# Patient Record
Sex: Male | Born: 1965 | Race: White | Hispanic: No | Marital: Single
Health system: Southern US, Community
[De-identification: ages and names within clinical notes are randomized; demographics above are authoritative.]

---

## 2013-12-08 ENCOUNTER — Ambulatory Visit: Payer: Self-pay | Admitting: Otolaryngology

## 2013-12-24 ENCOUNTER — Ambulatory Visit: Payer: Self-pay | Admitting: Otolaryngology

## 2013-12-26 LAB — PATHOLOGY REPORT

## 2014-03-17 ENCOUNTER — Emergency Department: Payer: Self-pay | Admitting: Emergency Medicine

## 2014-03-17 LAB — CBC
HCT: 48 % (ref 40.0–52.0)
HGB: 16.1 g/dL (ref 13.0–18.0)
MCH: 28.8 pg (ref 26.0–34.0)
MCHC: 33.5 g/dL (ref 32.0–36.0)
MCV: 86 fL (ref 80–100)
Platelet: 279 10*3/uL (ref 150–440)
RBC: 5.57 10*6/uL (ref 4.40–5.90)
RDW: 14.2 % (ref 11.5–14.5)
WBC: 8.3 10*3/uL (ref 3.8–10.6)

## 2014-03-17 LAB — BASIC METABOLIC PANEL
Anion Gap: 7 (ref 7–16)
BUN: 13 mg/dL (ref 7–18)
CHLORIDE: 105 mmol/L (ref 98–107)
CREATININE: 1.02 mg/dL (ref 0.60–1.30)
Calcium, Total: 8.6 mg/dL (ref 8.5–10.1)
Co2: 27 mmol/L (ref 21–32)
EGFR (Non-African Amer.): >60
GLUCOSE: 158 mg/dL — AB (ref 65–99)
OSMOLALITY: 281 (ref 275–301)
POTASSIUM: 3.1 mmol/L — AB (ref 3.5–5.1)
SODIUM: 139 mmol/L (ref 136–145)

## 2014-03-17 LAB — URINALYSIS, COMPLETE
Bacteria: NONE SEEN
Bilirubin,UR: NEGATIVE
Leukocyte Esterase: NEGATIVE
Nitrite: NEGATIVE
Ph: 5 (ref 4.5–8.0)
Specific Gravity: 1.029 (ref 1.003–1.030)
Squamous Epithelial: 1

## 2014-08-29 IMAGING — CT CT ORBITS WITHOUT CONTRAST
3 of 6 series · 15 of 40 positions shown, 18 images · non-contrast
Comparison: None

CLINICAL DATA: Left-sided hearing loss, left ear ache, and
dizziness for 2 weeks. Left cholesteatoma. Eustachian tube
dysfunction.

EXAM:
CT TEMPORAL BONES WITHOUT CONTRAST
TECHNIQUE: Axial and coronal plane CT imaging of the petrous temporal bones was
performed with thin-collimation image reconstruction. No intravenous
contrast was administered. Multiplanar CT image reconstructions were
also generated.

[Series 3: ax soft · axial · 0.33mm/px · z∈[+4,+22]mm · 2 of 27 slices shown]
[im 9/27  brain]
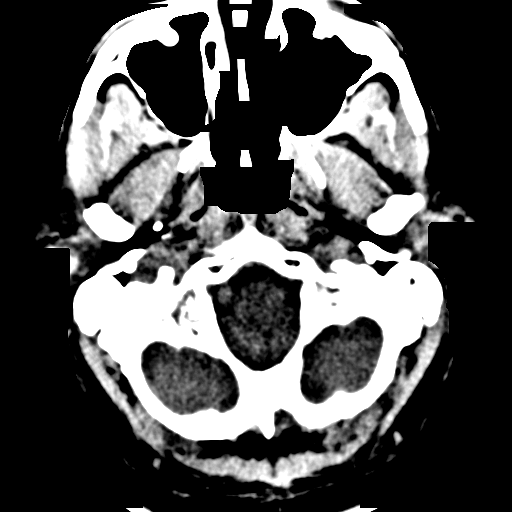
[im 18/27  brain]
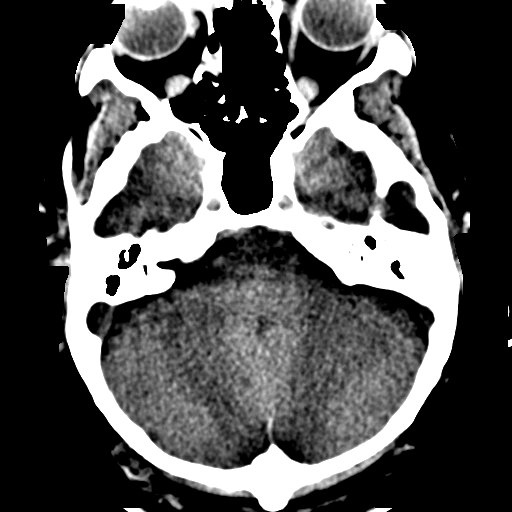

[Series 5: ax mag right · axial · 0.20mm/px · z∈[-8,+36]mm · 11 of 89 slices shown, 14 images]
[im 8/89  brain]
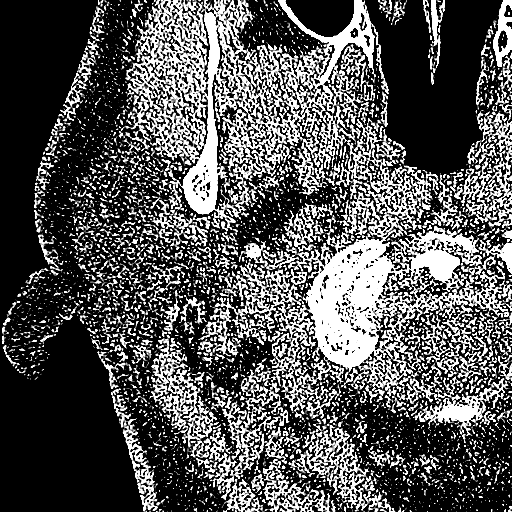
[im 8/89  bone]
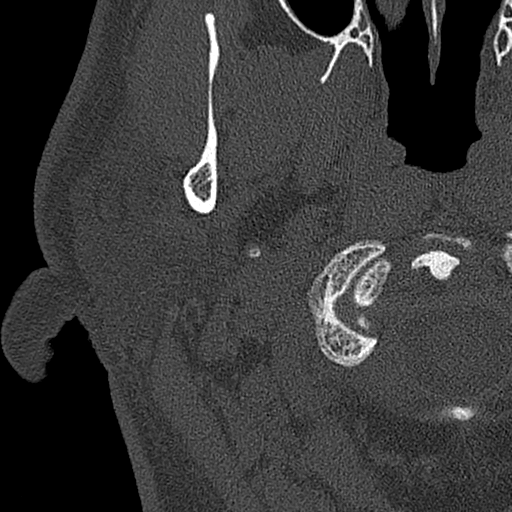
[im 15/89  bone]
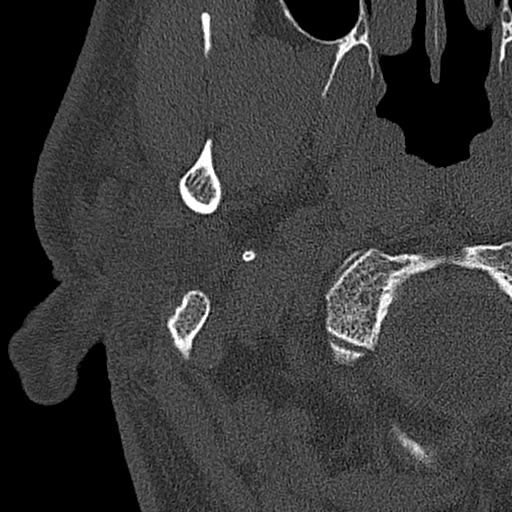
[im 23/89  bone]
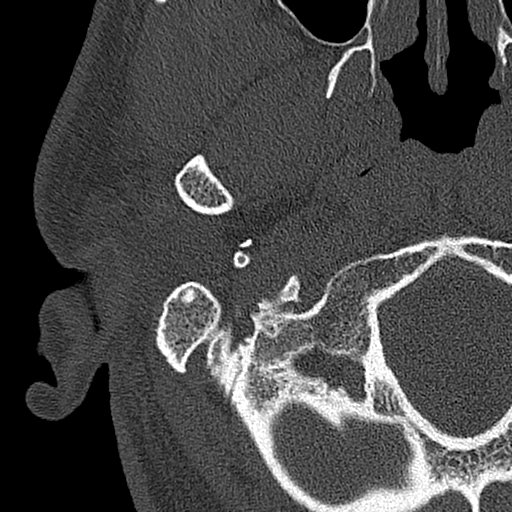
[im 30/89  bone]
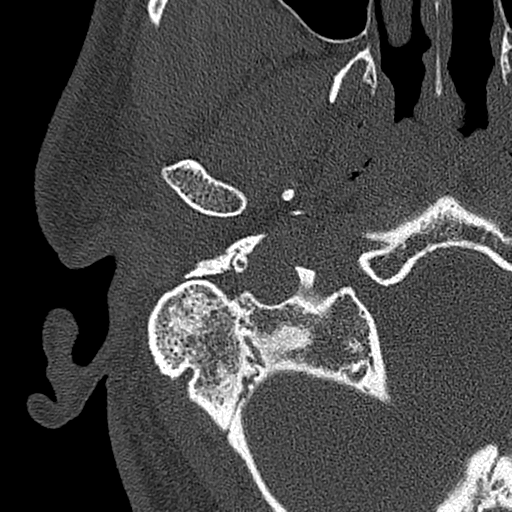
[im 37/89  brain]
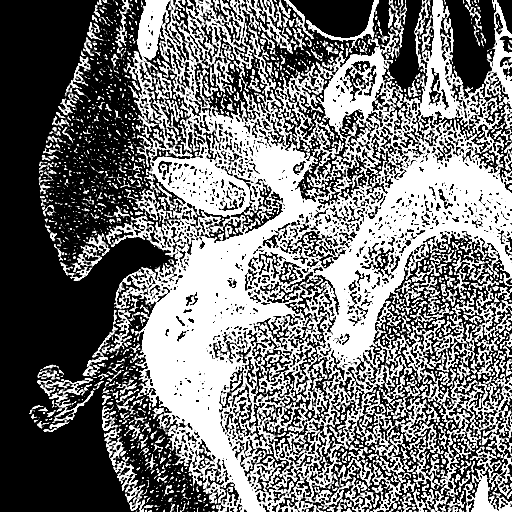
[im 37/89  bone]
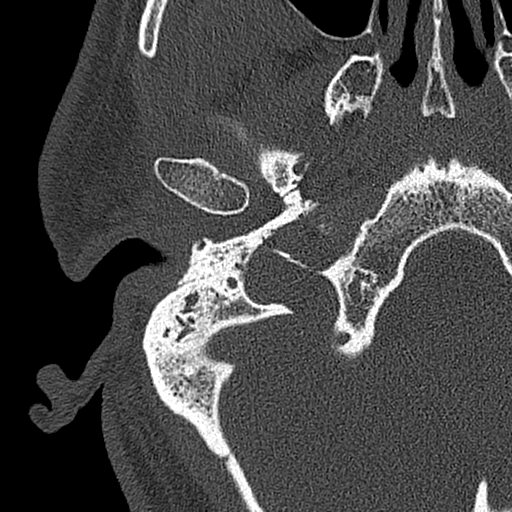
[im 45/89  bone]
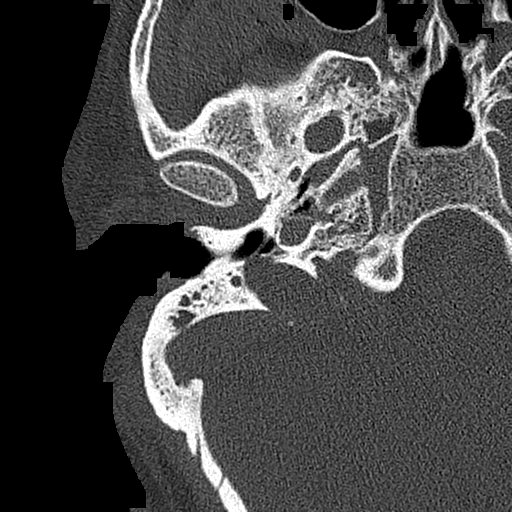
[im 52/89  bone]
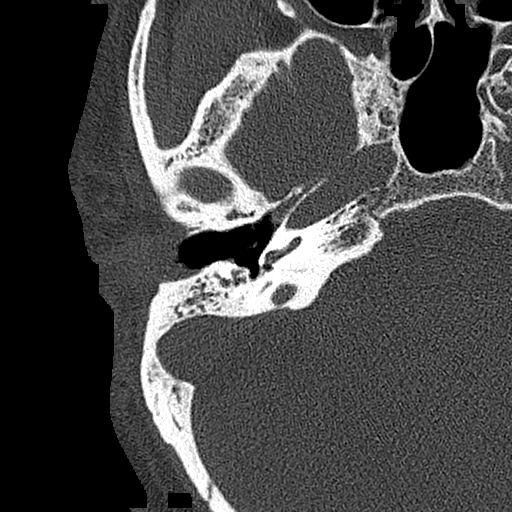
[im 59/89  bone]
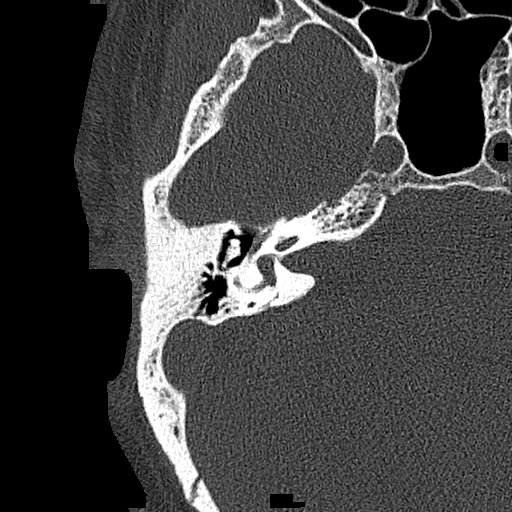
[im 67/89  brain]
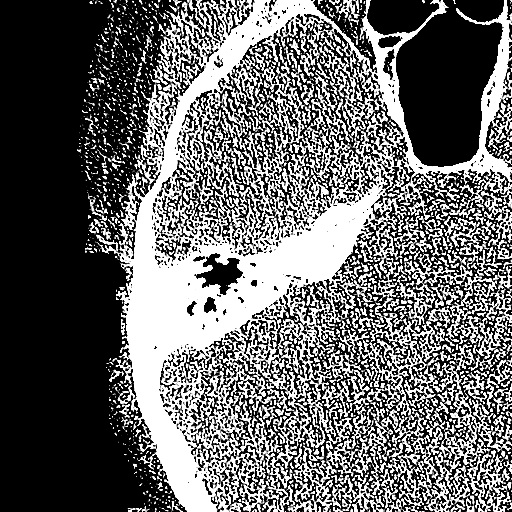
[im 67/89  bone]
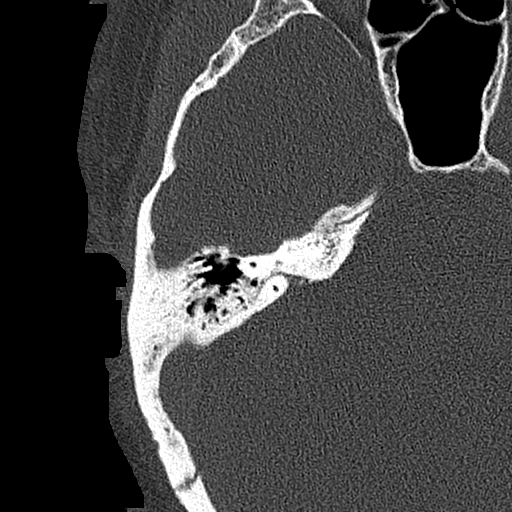
[im 74/89  bone]
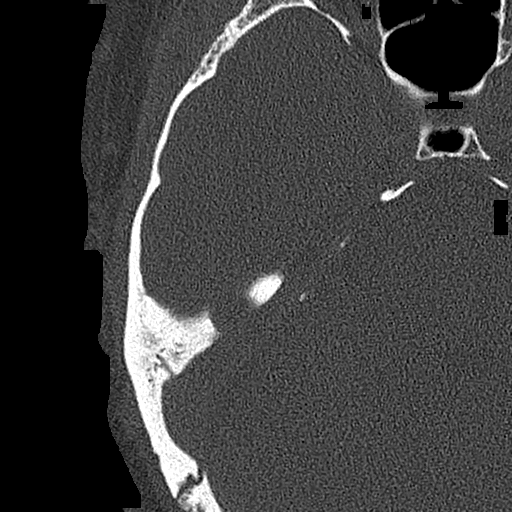
[im 81/89  bone]
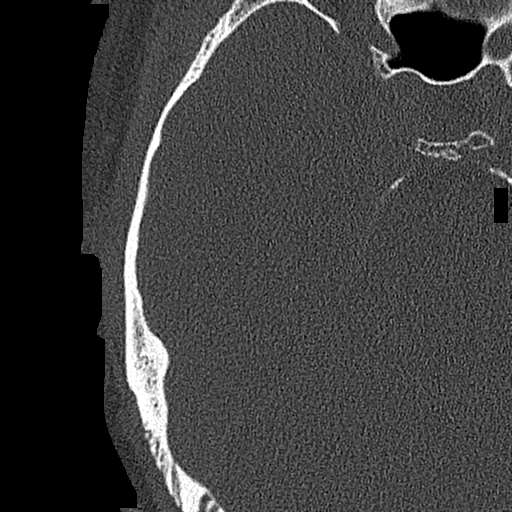

[Series 6: cor mag right · coronal · 0.20mm/px · 2 of 88 slices shown]
[im 30/88  bone]
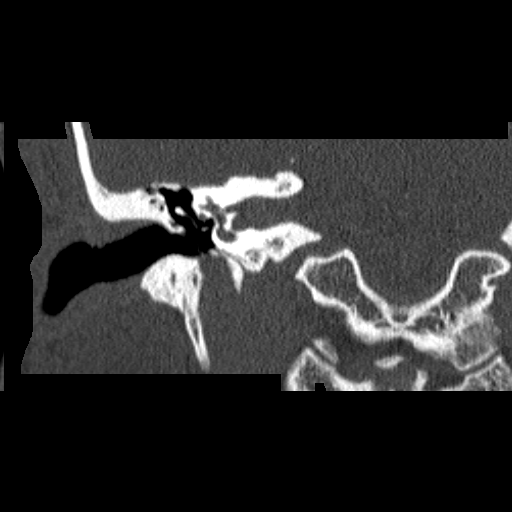
[im 59/88  bone]
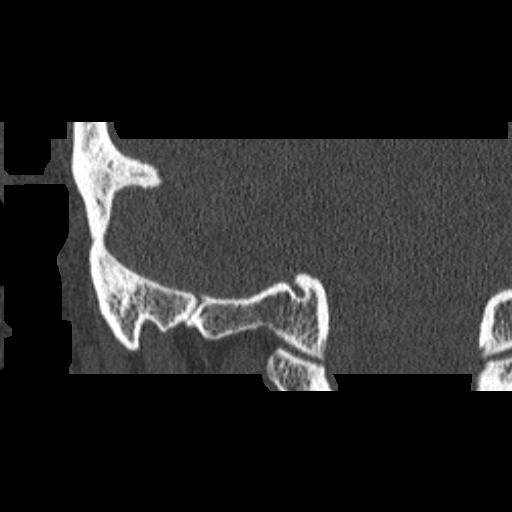

[15 of 40 positions shown; findings below may reference images not displayed]

FINDINGS: Right: The external auditory canal, tympanic membrane, ossicles,
tympanic cavity, cochlea, vestibule, semicircular canals, and
internal auditory canal are unremarkable. A small mastoid effusion
is present.

Left: A small amount of debris is noted laterally in the external
auditory canal. There is mild thickening of the tympanic membrane.
There is abnormal soft tissue in Prussak's space with slight
blunting of the scutum fluid/soft tissue is also present posteriorly
in the tympanic cavity and surrounds the ossicles. The malleus and
incus appear intact. The stapes is suboptimally visualized due to
surrounding fluid/soft tissue. The cochlea, vestibule, semicircular
canals, and internal auditory canal are unremarkable. There is
opacification of multiple mastoid air cells. There is no definite
evidence of osseous dehiscence.

Trace mucosal thickening is partially visualized in the ethmoid air
cells and maxillary sinuses bilaterally. A mucous retention cyst is
noted in the right maxillary sinus. Temporomandibular joints are
located.
IMPRESSION: 1. Abnormal fluid/soft tissue within the left middle ear without
frank osseous destruction but with possible mild blunting of the
scutum. A cholesteatoma is not excluded.
2. Moderate left mastoid effusion.
3. Trace right mastoid fluid, otherwise unremarkable appearance of
the right temporal bone.

## 2014-11-01 ENCOUNTER — Emergency Department: Payer: Self-pay | Admitting: Emergency Medicine

## 2014-11-01 LAB — BASIC METABOLIC PANEL
Anion Gap: 7 (ref 7–16)
BUN: 13 mg/dL (ref 7–18)
CO2: 30 mmol/L (ref 21–32)
Calcium, Total: 8.3 mg/dL — ABNORMAL LOW (ref 8.5–10.1)
Chloride: 107 mmol/L (ref 98–107)
Creatinine: 0.94 mg/dL (ref 0.60–1.30)
EGFR (African American): >60
EGFR (Non-African Amer.): >60
GLUCOSE: 151 mg/dL — AB (ref 65–99)
Osmolality: 290 (ref 275–301)
Potassium: 4.9 mmol/L (ref 3.5–5.1)
SODIUM: 144 mmol/L (ref 136–145)

## 2014-11-01 LAB — CBC
HCT: 47.1 %
HGB: 15.9 g/dL
MCH: 29.1 pg
MCHC: 33.8 g/dL
MCV: 86 fL
Platelet: 280 x10 3/mm 3
RBC: 5.48 x10 6/mm 3
RDW: 13.9 %
WBC: 7.4 x10 3/mm 3

## 2014-11-01 LAB — TROPONIN I: Troponin-I: <0.02 ng/mL

## 2015-04-17 NOTE — Op Note (Signed)
PATIENT NAME:  Taylor Cox, KREMPASKY MR#:  [id number] DATE OF BIRTH:  1966-10-23  DATE OF PROCEDURE:  12/24/2013  PREOPERATIVE DIAGNOSIS: Left cholesteatoma with conductive hearing loss.   POSTOPERATIVE DIAGNOSIS: Left cholesteatoma with conductive hearing loss.   PROCEDURE: Left tympanoplasty with atticotomy and removal of cholesteatoma.   SURGEON: Marion Downer, MD  ANESTHESIA: General endotracheal.   INDICATIONS: The patient with a left cholesteatoma with associated conductive hearing loss.   FINDINGS: There was a cholesteatoma involving the mesotympanum extending into the attic. This was overlying the incus and encasing the chorda tympani nerve. However, the incus was only mildly eroded near its attachment to the stapes but still intact. The cholesteatoma was completely removed in the attic and the atticotomy defect reconstructed with a cartilage graft.   COMPLICATIONS: None.   DESCRIPTION OF PROCEDURE: After obtaining informed consent, the patient was taken to the operating room and placed in the supine position. After induction of general endotracheal anesthesia, the patient was turned 90 degrees. Facial nerve monitor electrodes were placed in the usual fashion and proper functioning confirmed. The left ear was then injected postauricularly  as well as into the canal skin with 1% lidocaine with epinephrine 1:200,000.  The patient was then prepped and draped in the usual sterile fashion. Canal incisions were made at 12 and 6 o'clock  vertically followed by a horizontal incision just behind the annulus near the cholesteatoma. A postauricular incision was made and carried down to the mastoid cortex. The temporalis fascia was harvested as a graft and set aside to be pressed and dried. The mastoid periosteum was elevated down to the canal and the canal periosteum and posterior canal flap elevated down to the previously made canal cuts. The ear was then retracted forward with a Penrose drain and  self-retaining retractor. A weapon was used to elevate the tympanomeatal flap down to the annulus. A Rosen needle was then used to carefully elevate the annulus. The cholesteatoma was seen extending up into the attic region as the annulus was raised. An atticotomy was performed as well as conservative canaloplasty to provide better visualization using a #3 diamond bur.  Facial nerve monitoring was used during the entire procedure for safety. The attic was opened exposing the extent of the cholesteatoma. A portion of the bone in the attic was removed with a curette for better visualization. The cholesteatoma did engulf the chorda tympani nerve which had to be divided. The cholesteatoma was then carefully dissected out of the middle ear and the attic utilizing microsurgical dissection under the operating microscope. The cholesteatoma was easily freed away from the incus. There was some focal erosion of the distal incus near this stapes but the incus and stapes superstructure were intact. The cholesteatoma was carefully dissected away from the stapes and the stapedius tendon and removed. Bellucci scissors were used to outline the segment of the tympanic membrane that had to be resected in conjunction with the cholesteatoma. The middle ear was then irrigated and inspected and there was no residual disease. The ossicles were palpated and good mobility of the incus stapes complex noted. The facial nerve was also notably visualized easily during the surgery and not disturbed. A piece of conchal cartilage was harvested and trimmed to reconstruct the atticotomy defect. The temporalis fascia graft was trimmed to the appropriate size and placed in the middle ear. Floxin moistened Gelfoam was then packed in the middle ear to support the graft against the tympanic membrane remnant. With the graft well secured, the cartilage  was placed over the atticotomy defect and the graft and tympanic membrane remnant were placed in the  anatomic position. Floxin moistened Gelfoam was packed lateral to the reconstructed tympanic membrane. The postauricular ear incision was then closed with 4-0 Vicryl suture followed by 5-0 fast absorbing gut suture for the skin. The canal was re-evaluated and the posterior canal flap everted and then placed into anatomic position so that it overlapped the temporalis fascia graft slightly. Floxin moistened Gelfoam was then packed in the canal further securing the tympanic membrane. Bacitracin ointment was applied to the postauricular incision and into the remainder of the canal. A Glasscock dressing was then placed to provide pressure. The patient was then returned to the anesthesiologist for awakening. He was awakened and taken to the recovery room in good condition postoperatively. Blood loss was less than 25 mL.    ____________________________ Ollen GrossPaul S. Willeen CassBennett, MD psb:dp D: 12/24/2013 11:52:52 ET T: 12/24/2013 13:02:21 ET JOB#: [id number]  cc: Ollen GrossPaul S. Willeen CassBennett, MD, <Dictator> Sandi MealyPAUL S Izel Eisenhardt MD ELECTRONICALLY SIGNED 12/30/2013 8:43

## 2015-07-23 IMAGING — CR DG CHEST 2V
1 series · 2 of 2 positions shown · non-contrast
Comparison: None.

CLINICAL DATA: intermittent chest pressure and sob for several days
pt has stomach ache. no hx of lung/Leandro David Umasi pt has hx of
kidney stones and is smoker

EXAM:
CHEST  2 VIEW

[Series 1: dxr chest pa (or ap) and lateral · 0.14mm/px · 2 of 2 slices shown]
[im 1/2]
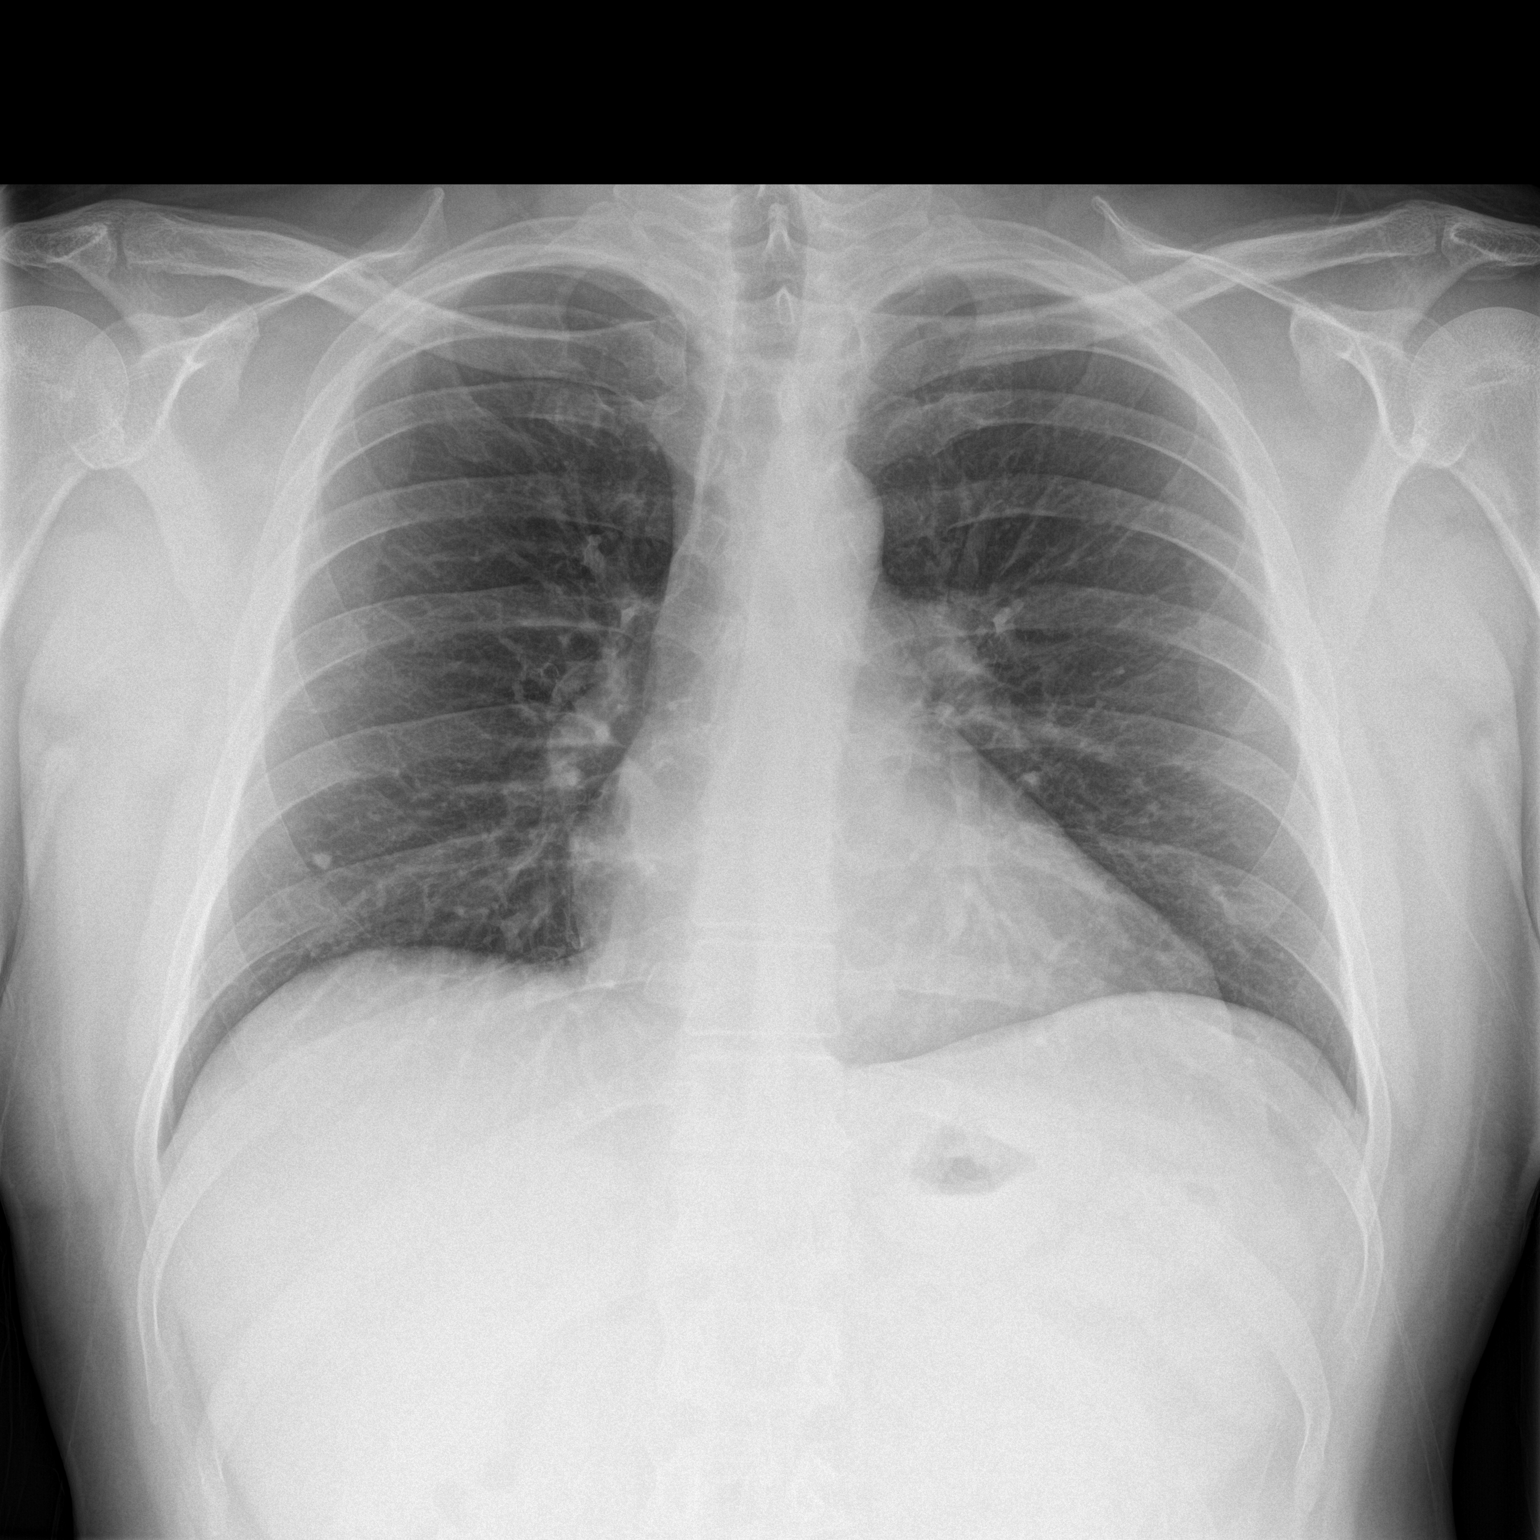
[im 2/2]
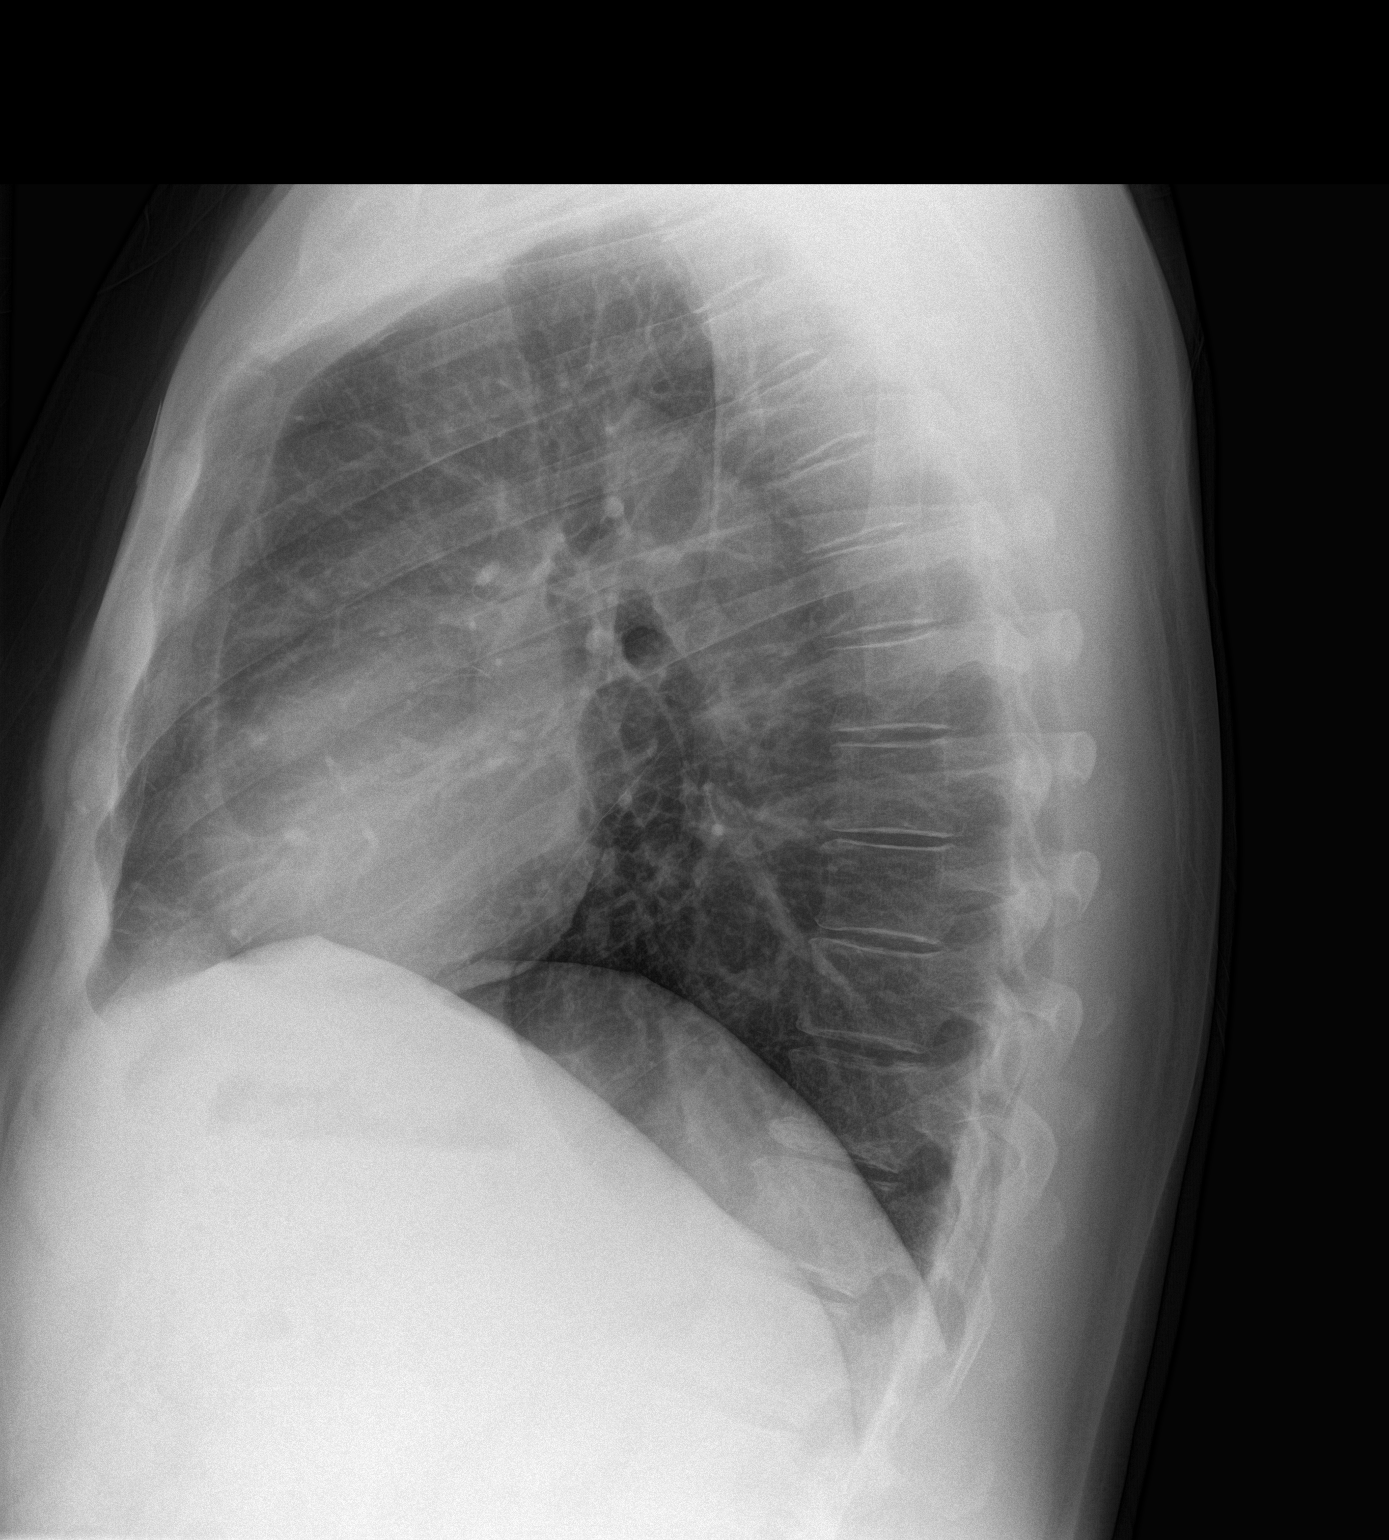

[2 of 2 positions shown; findings below may reference images not displayed]

FINDINGS: Heart, mediastinum and hila are unremarkable. Small nodule in the
right middle lobe are relatively dense likely a granuloma. Lungs
otherwise clear. No pleural effusion or pneumothorax. Bony thorax is
unremarkable.
IMPRESSION: 1. No acute cardiopulmonary disease.
2. Small nodule right middle lobe likely a granuloma. Recommend
followup chest radiograph in 3 months to document stability.
